# Patient Record
Sex: Male | Born: 1960 | Race: White | Hispanic: No | Marital: Single | Smoking: Never smoker
Health system: Southern US, Community
[De-identification: ages and names within clinical notes are randomized; demographics above are authoritative.]

## PROBLEM LIST (undated history)

## (undated) DIAGNOSIS — K219 Gastro-esophageal reflux disease without esophagitis: Secondary | ICD-10-CM

## (undated) DIAGNOSIS — S069XAA Unspecified intracranial injury with loss of consciousness status unknown, initial encounter: Secondary | ICD-10-CM

## (undated) DIAGNOSIS — S069X9A Unspecified intracranial injury with loss of consciousness of unspecified duration, initial encounter: Secondary | ICD-10-CM

## (undated) HISTORY — PX: SHUNT REVISION: SHX343

---

## 2010-11-04 ENCOUNTER — Inpatient Hospital Stay (INDEPENDENT_AMBULATORY_CARE_PROVIDER_SITE_OTHER)
Admission: RE | Admit: 2010-11-04 | Discharge: 2010-11-04 | Disposition: A | Discharge status: Home / Self Care | Payer: Medicare Other | Source: Ambulatory Visit | Attending: Family Medicine | Admitting: Family Medicine

## 2010-11-04 ENCOUNTER — Encounter: Payer: Self-pay | Admitting: Family Medicine

## 2010-11-04 DIAGNOSIS — H6120 Impacted cerumen, unspecified ear: Secondary | ICD-10-CM

## 2010-11-04 DIAGNOSIS — H60339 Swimmer's ear, unspecified ear: Secondary | ICD-10-CM

## 2011-04-18 NOTE — Progress Notes (Signed)
Summary: EAR INFECTION(rm 2 )   Vital Signs:  Patient Profile:   50 Years Old Male CC:      left ear pain x 4 days-intermittent Weight:      100 pounds O2 Sat:      100 % O2 treatment:    Room Air Temp:     98.2 degrees F oral Pulse rate:   69 / minute Resp:     16 per minute BP sitting:   111 / 72  (left arm) Cuff size:   regular  Vitals Entered By: Lajean Saver RN (November 04, 2010 4:16 PM)                  Updated Prior Medication List: No Medications Current Allergies: No known allergies History of Present Illness Chief Complaint: left ear pain x 4 days-intermittent History of Present Illness:  Subjective:  Patient complains of pain and drainage from his left ear for 3 to 4 days.  No sinus congestion.  No fevers, chills, and sweats   REVIEW OF SYSTEMS Constitutional Symptoms      Denies fever, chills, night sweats, weight loss, weight gain, and fatigue.  Eyes       Denies change in vision, eye pain, eye discharge, glasses, contact lenses, and eye surgery. Ear/Nose/Throat/Mouth       Complains of ear pain and ear discharge.      Denies hearing loss/aids, change in hearing, dizziness, frequent runny nose, frequent nose bleeds, sinus problems, sore throat, hoarseness, and tooth pain or bleeding.      Comments: left Respiratory       Denies dry cough, productive cough, wheezing, shortness of breath, asthma, bronchitis, and emphysema/COPD.  Cardiovascular       Denies murmurs, chest pain, and tires easily with exhertion.    Gastrointestinal       Denies stomach pain, nausea/vomiting, diarrhea, constipation, blood in bowel movements, and indigestion. Genitourniary       Denies painful urination, blood or discharge from penis, kidney stones, and loss of urinary control. Neurological       Denies paralysis, seizures, and fainting/blackouts. Musculoskeletal       Denies muscle pain, joint pain, joint stiffness, decreased range of motion, redness, swelling, muscle  weakness, and gout.  Skin       Denies bruising, unusual mles/lumps or sores, and hair/skin or nail changes.  Psych       Denies mood changes, temper/anger issues, anxiety/stress, speech problems, depression, and sleep problems. Other Comments: Denies fever. Ear pain intermittent x 4 days.    Past History:  Past Medical History: TBI  Past Surgical History: VP shunt  Family History: Mother- Holiday representative [place] Father- Heart disease  Social History: Married Never Smoked Alcohol use-no Drug use-no Smoking Status:  never Drug Use:  no   Objective:  No acute distress  Ears:   Right canal occluded with cerumen.  Left canal erythematous with small amount of exudate.  Tympanic membrane poorly visualized  Assessment New Problems: CERUMEN IMPACTION, RIGHT (ICD-380.4) OTITIS EXTERNA, ACUTE, LEFT (ICD-380.12)   Plan New Medications/Changes: AMOXICILLIN 875 MG TABS (AMOXICILLIN) One by mouth two times a day  #20 x 0, 11/04/2010, Donna Christen MD CORTISPORIN 3.5-10000-1 SOLN [facility]) Place 4 gtts in affected ear three times a day to qid  #10cc x 0, 11/04/2010, Donna Christen MD  New Orders: New Patient Level III [id number] Planning Comments:   Begin Cortisporin Otic Susp and oral amoxicillin.  Follow-up with ENT for ear lavage.                                                                                                                                                                                                                                                   The patient and/or caregiver has been counseled thoroughly with regard to medications prescribed including dosage, schedule, interactions, rationale for use, and possible side effects and they verbalize understanding.   Diagnoses and expected course of recovery discussed and will return if not improved as expected or if the condition worsens. Patient and/or caregiver verbalized understanding.  Prescriptions: AMOXICILLIN 875 MG TABS (AMOXICILLIN) One by mouth two times a day  #20 x 0   Entered and Authorized by:   Donna Christen MD   Signed by:   Donna Christen MD on 11/04/2010   Method used:   Print then Give to Patient   RxID:   [id number] CORTISPORIN 3.5-10000-1 SOLN [facility]) Place 4 gtts in affected ear three times a day to qid  #10cc x 0   Entered and Authorized by:   Donna Christen MD   Signed by:   Donna Christen MD on 11/04/2010   Method used:   Print then Give to Patient   RxID:   [id number]   Orders Added: 1)  New Patient Level III [01027]

## 2012-08-24 ENCOUNTER — Encounter: Payer: Self-pay | Admitting: *Deleted

## 2012-08-24 ENCOUNTER — Emergency Department (INDEPENDENT_AMBULATORY_CARE_PROVIDER_SITE_OTHER)
Admission: EM | Admit: 2012-08-24 | Discharge: 2012-08-24 | Disposition: A | Discharge status: Home / Self Care | Payer: Medicare Other | Source: Home / Self Care | Attending: Family Medicine | Admitting: Family Medicine

## 2012-08-24 DIAGNOSIS — J329 Chronic sinusitis, unspecified: Secondary | ICD-10-CM

## 2012-08-24 DIAGNOSIS — H6643 Suppurative otitis media, unspecified, bilateral: Secondary | ICD-10-CM

## 2012-08-24 DIAGNOSIS — H6640 Suppurative otitis media, unspecified, unspecified ear: Secondary | ICD-10-CM

## 2012-08-24 HISTORY — DX: Unspecified intracranial injury with loss of consciousness of unspecified duration, initial encounter: S06.9X9A

## 2012-08-24 HISTORY — DX: Unspecified intracranial injury with loss of consciousness status unknown, initial encounter: S06.9XAA

## 2012-08-24 HISTORY — DX: Gastro-esophageal reflux disease without esophagitis: K21.9

## 2012-08-24 NOTE — ED Provider Notes (Signed)
History     CSN: [id number]  Arrival date & time 08/24/12  1341   First MD Initiated Contact with Patient 08/24/12 1353      Chief Complaint  Patient presents with  . Ear Drainage   HPI  Patient presents today with chief complaint of bloody or drainage. Patient is noted to have a baseline history of traumatic brain injury and is currently wheelchair-bound. His primary care for by his sister. Sister states she's been out of town for the past 2 days and when she returned she noticed the patient had purulent drainage out of the left ear and blood A/P really drainage of the right ear. Sister reports that patient has had some difficulty eating today stating his food up. Otherwise patient has been at baseline. No fevers or chills or nausea or vomiting. No shortness of breath. Past Medical History  Diagnosis Date  . TBI (traumatic brain injury)   . GERD (gastroesophageal reflux disease)     Past Surgical History  Procedure Laterality Date  . Shunt revision      Family History  Problem Relation Age of Onset  . Cancer Mother     breast  . Heart failure Father     History  Substance Use Topics  . Smoking status: Never Smoker   . Smokeless tobacco: Not on file  . Alcohol Use: No      Review of Systems  All other systems reviewed and are negative.    Allergies  Review of patient's allergies indicates no known allergies.  Home Medications   Current Outpatient Rx  Name  Route  Sig  Dispense  Refill  . omeprazole (PRILOSEC) 20 MG capsule   Oral   Take 20 mg by mouth as needed.           BP 112/70  Pulse 78  Temp(Src) 97.3 F (36.3 C) (Oral)  Resp 18  SpO2 97%  Physical Exam  Constitutional:  Wheelchair bound, minimally verbal, kyphotic posture    HENT:  Head: Normocephalic and atraumatic.  Purulent drainage of ears bilaterally  Purosanguinous drainage out of R ear  +nasal erythema, rhinorrhea bilaterally, + post oropharyngeal erythema    Eyes:  Conjunctivae are normal. Pupils are equal, round, and reactive to light.  Neck: Normal range of motion.  Cardiovascular: Normal rate, regular rhythm and normal heart sounds.   Pulmonary/Chest: Effort normal.  Faint rales/transmitted upper airway sounds  Abdominal: Soft.  Musculoskeletal: Normal range of motion.  Neurological: He is alert.  Skin: Skin is warm.    ED Course  Procedures (including critical care time)  Labs Reviewed - No data to display No results found.   1. Suppurative otitis media of both ears   2. Sinusitis       MDM  Unclear if there is TM rupture present or not.  Given baseline habitus, pt referred to ENT for further evaluation.  Same day appointment made.  Pt discharged from clinic with pt to be seen at [facility] ASAP.      The patient and/or caregiver has been counseled thoroughly with regard to treatment plan and/or medications prescribed including dosage, schedule, interactions, rationale for use, and possible side effects and they verbalize understanding. Diagnoses and expected course of recovery discussed and will return if not improved as expected or if the condition worsens. Patient and/or caregiver verbalized understanding.             Doree Albee, MD 08/24/12 1431

## 2012-08-24 NOTE — ED Notes (Signed)
Pt's caregiver reports seeing bloody yellow d/c from his ears bilaterally x today. Denies fever.

## 2019-05-20 ENCOUNTER — Other Ambulatory Visit: Payer: Self-pay

## 2019-05-20 ENCOUNTER — Emergency Department (INDEPENDENT_AMBULATORY_CARE_PROVIDER_SITE_OTHER): Payer: Medicare Other

## 2019-05-20 ENCOUNTER — Emergency Department (INDEPENDENT_AMBULATORY_CARE_PROVIDER_SITE_OTHER)
Admission: EM | Admit: 2019-05-20 | Discharge: 2019-05-20 | Disposition: A | Discharge status: Home / Self Care | Payer: Medicare Other | Source: Home / Self Care

## 2019-05-20 ENCOUNTER — Encounter: Payer: Self-pay | Admitting: *Deleted

## 2019-05-20 DIAGNOSIS — M25522 Pain in left elbow: Secondary | ICD-10-CM

## 2019-05-20 DIAGNOSIS — S59902A Unspecified injury of left elbow, initial encounter: Secondary | ICD-10-CM

## 2019-05-20 NOTE — ED Triage Notes (Signed)
Pt's sister reports he fell out of the bed yesterday morning and when she found him his arm was stuck in the rail of the bed. LT elbow swelling and loss of grip in LT hand.

## 2019-05-20 NOTE — Discharge Instructions (Signed)
Your x-ray was negative for fracture although revealed arthritic changes of the elbow. For swelling, continue ice application, elevation, and rest. Continue Ibuprofen if needed for pain. If weakness and or swelling doesn't improve in 48 hours, follow-up with Dr. Karie Schwalbe at [facility]. His contact information is listed above.

## 2021-03-07 IMAGING — DX DG ELBOW COMPLETE 3+V*L*
6 series · 6 of 6 positions shown · non-contrast
Comparison: None.

CLINICAL DATA: Fall.  Pain and swelling

EXAM:
LEFT ELBOW - COMPLETE 3+ VIEW

[elbow ap (1 of 3)]
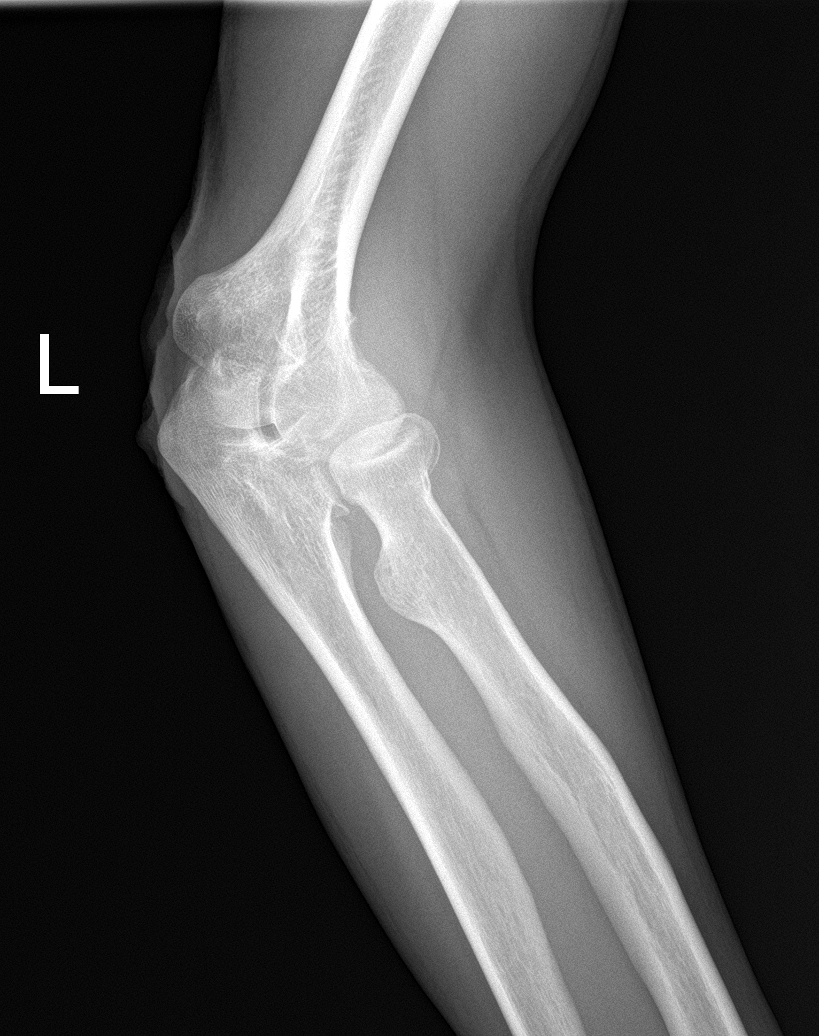

[elbow obl (1 of 2)]
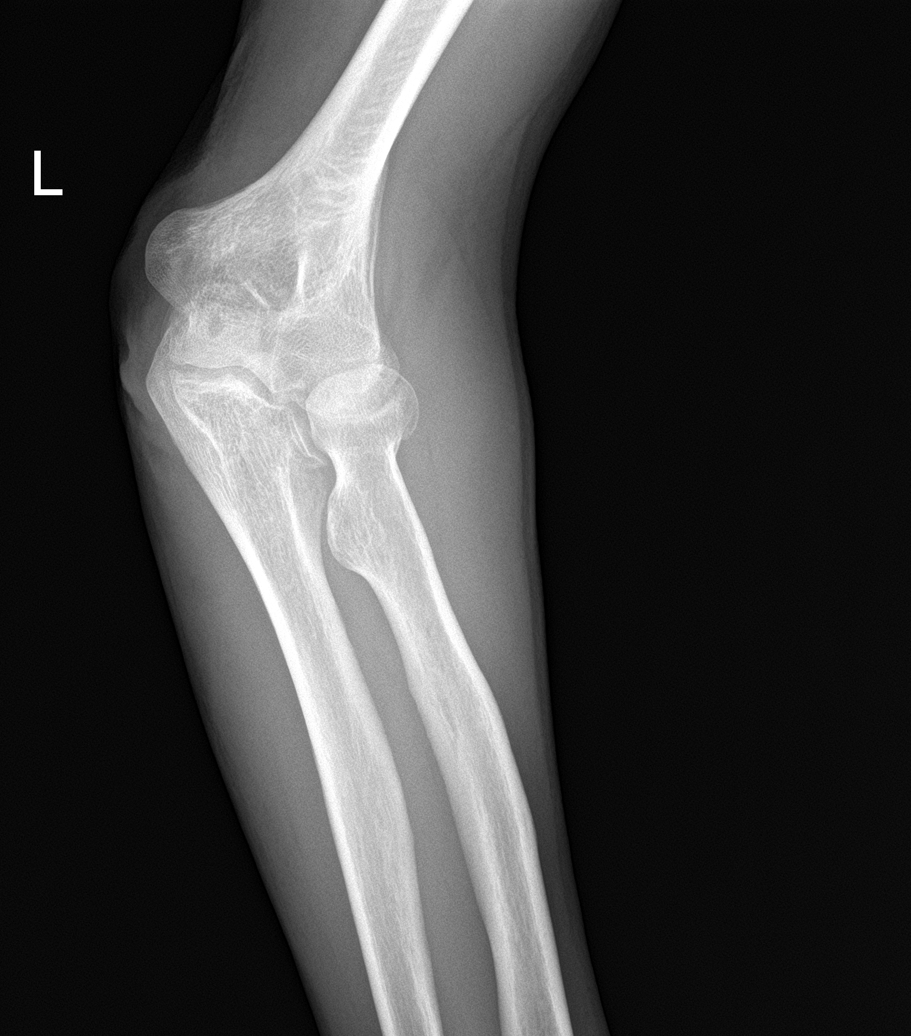

[elbow obl (2 of 2)]
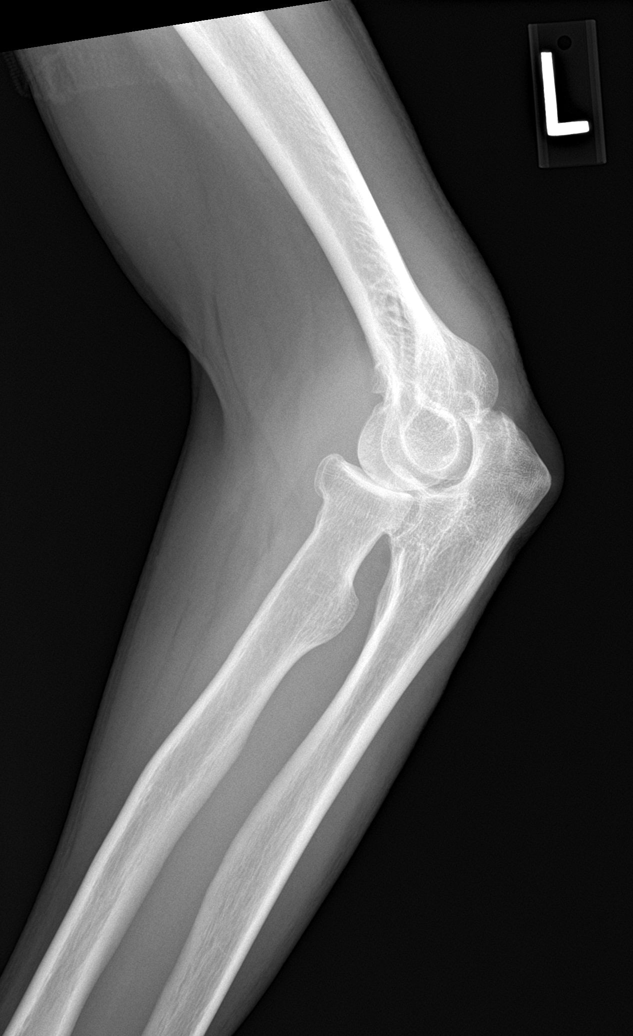

[elbow lat]
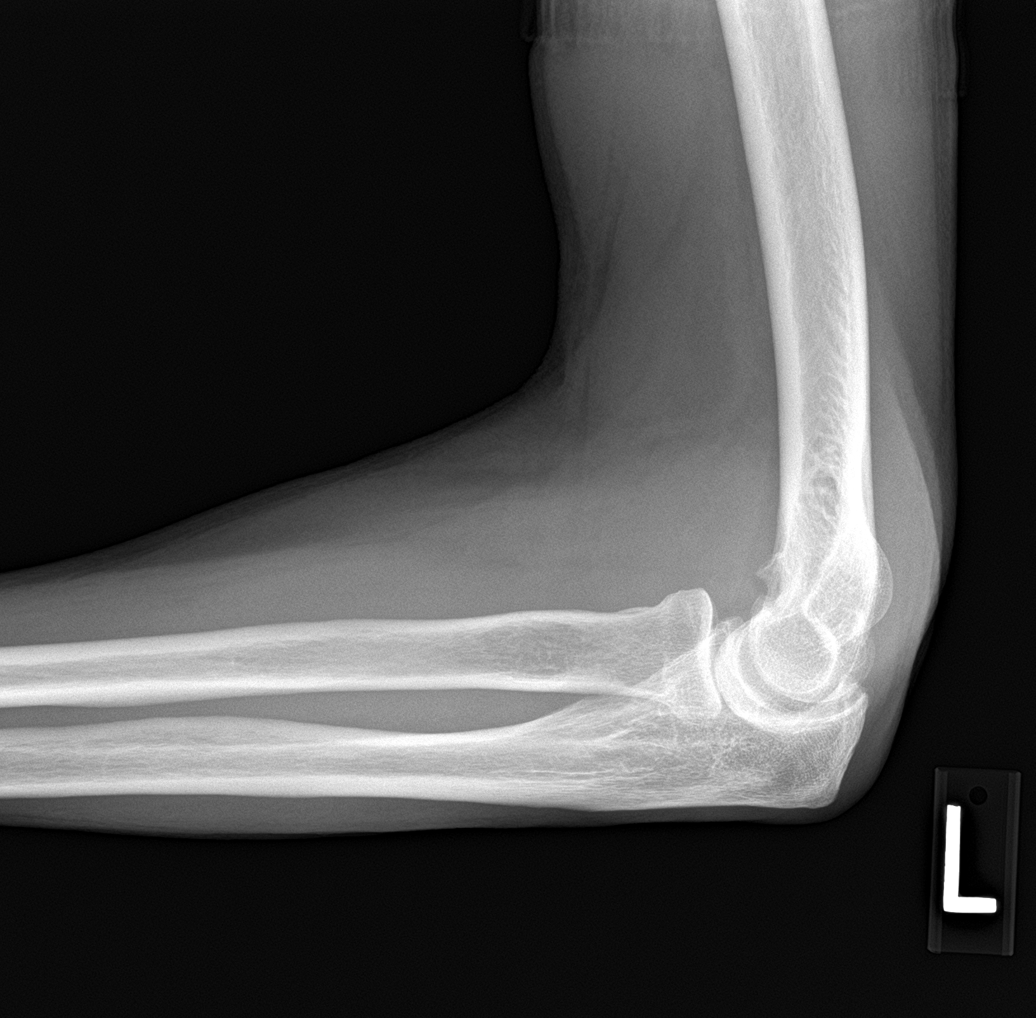

[elbow ap (2 of 3)]
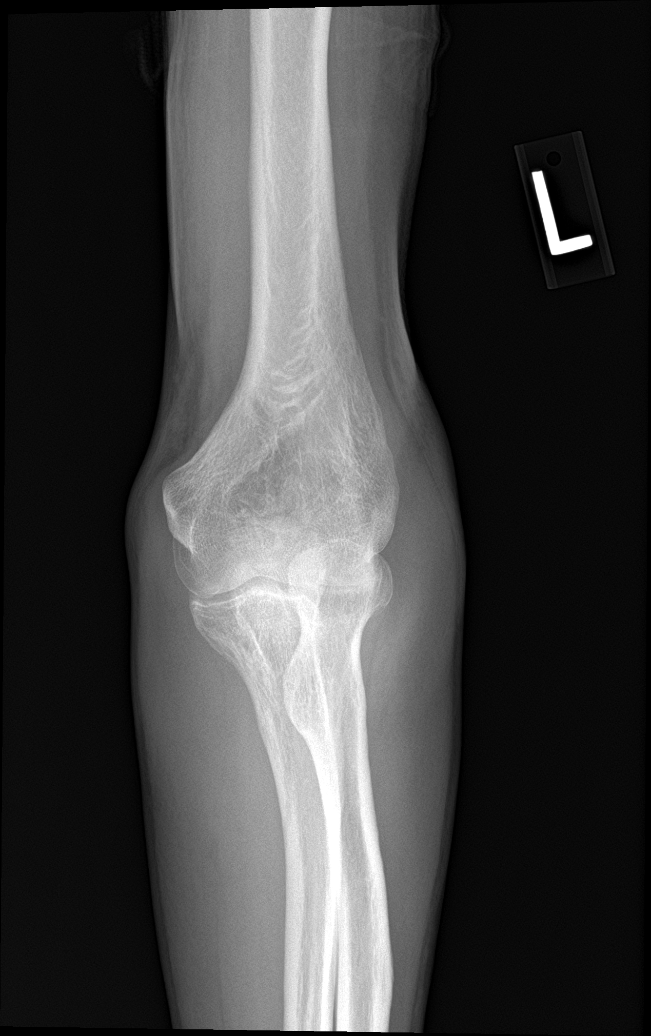

[elbow ap (3 of 3)]
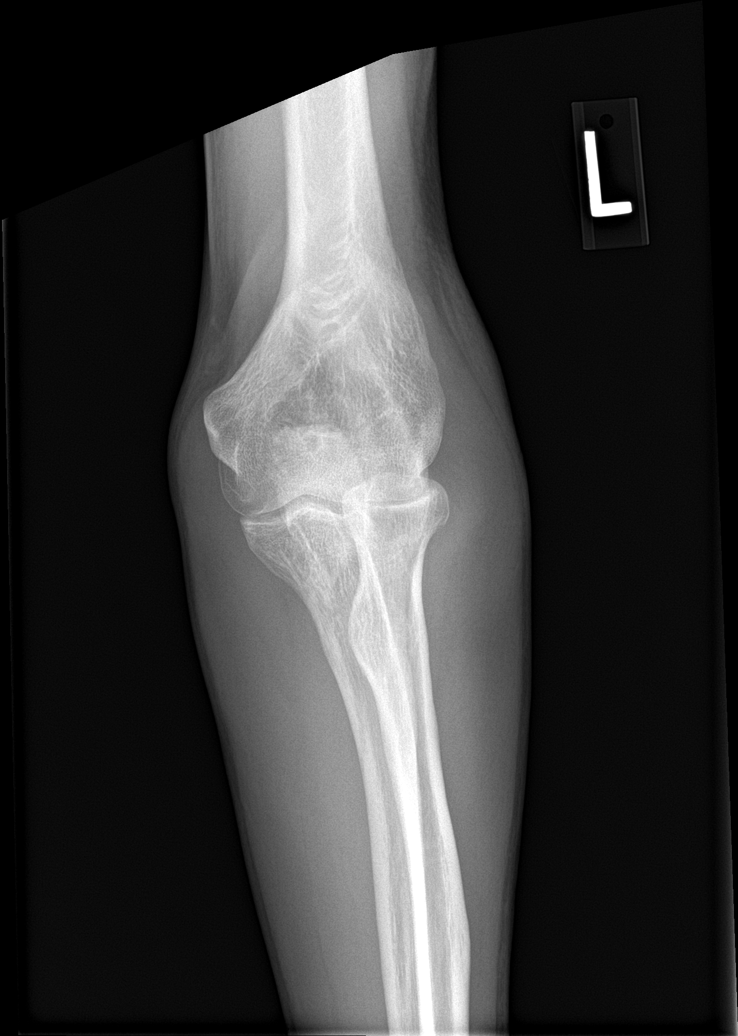

[6 of 6 positions shown; findings below may reference images not displayed]

FINDINGS: Negative for fracture or joint effusion. Mild degenerative change in
the elbow
IMPRESSION: Negative for fracture.
